# Patient Record
Sex: Female | Born: 1980 | Race: White | Hispanic: Yes | Marital: Single | Smoking: Never smoker
Health system: Southern US, Community
[De-identification: ages and names within clinical notes are randomized; demographics above are authoritative.]

---

## 2014-04-30 ENCOUNTER — Encounter (HOSPITAL_COMMUNITY): Payer: Self-pay | Admitting: Emergency Medicine

## 2014-04-30 ENCOUNTER — Emergency Department (HOSPITAL_COMMUNITY)
Admission: EM | Admit: 2014-04-30 | Discharge: 2014-04-30 | Disposition: A | Discharge status: Home / Self Care | Payer: 59 | Attending: Emergency Medicine | Admitting: Emergency Medicine

## 2014-04-30 ENCOUNTER — Emergency Department (HOSPITAL_COMMUNITY): Payer: 59

## 2014-04-30 DIAGNOSIS — Y92410 Unspecified street and highway as the place of occurrence of the external cause: Secondary | ICD-10-CM | POA: Insufficient documentation

## 2014-04-30 DIAGNOSIS — Z791 Long term (current) use of non-steroidal anti-inflammatories (NSAID): Secondary | ICD-10-CM | POA: Insufficient documentation

## 2014-04-30 DIAGNOSIS — Z79899 Other long term (current) drug therapy: Secondary | ICD-10-CM | POA: Insufficient documentation

## 2014-04-30 DIAGNOSIS — F411 Generalized anxiety disorder: Secondary | ICD-10-CM | POA: Insufficient documentation

## 2014-04-30 DIAGNOSIS — M542 Cervicalgia: Secondary | ICD-10-CM

## 2014-04-30 DIAGNOSIS — R0789 Other chest pain: Secondary | ICD-10-CM

## 2014-04-30 DIAGNOSIS — S199XXA Unspecified injury of neck, initial encounter: Secondary | ICD-10-CM

## 2014-04-30 DIAGNOSIS — S0993XA Unspecified injury of face, initial encounter: Secondary | ICD-10-CM | POA: Insufficient documentation

## 2014-04-30 DIAGNOSIS — Z3202 Encounter for pregnancy test, result negative: Secondary | ICD-10-CM | POA: Insufficient documentation

## 2014-04-30 DIAGNOSIS — F419 Anxiety disorder, unspecified: Secondary | ICD-10-CM

## 2014-04-30 DIAGNOSIS — S298XXA Other specified injuries of thorax, initial encounter: Secondary | ICD-10-CM | POA: Insufficient documentation

## 2014-04-30 DIAGNOSIS — Y9389 Activity, other specified: Secondary | ICD-10-CM | POA: Insufficient documentation

## 2014-04-30 LAB — POC URINE PREG, ED: Preg Test, Ur: NEGATIVE

## 2014-04-30 LAB — TROPONIN I: Troponin I: <0.3 ng/mL (ref <0.30)

## 2014-04-30 MED ORDER — CYCLOBENZAPRINE HCL 10 MG PO TABS
5.0000 mg | ORAL_TABLET | Freq: Once | ORAL | Status: AC
  Administered 2014-04-30: 5 mg via ORAL
  Filled 2014-04-30: qty 1

## 2014-04-30 MED ORDER — NAPROXEN 375 MG PO TABS: 375.0000 mg | ORAL_TABLET | Freq: Two times a day (BID) | ORAL | Status: AC

## 2014-04-30 MED ORDER — CYCLOBENZAPRINE HCL 5 MG PO TABS: 5.0000 mg | ORAL_TABLET | Freq: Three times a day (TID) | ORAL | Status: AC | PRN

## 2014-04-30 MED ORDER — IBUPROFEN 200 MG PO TABS
600.0000 mg | ORAL_TABLET | Freq: Once | ORAL | Status: AC
  Administered 2014-04-30: 600 mg via ORAL
  Filled 2014-04-30: qty 3

## 2014-04-30 NOTE — Progress Notes (Signed)
  CARE MANAGEMENT ED NOTE 04/30/2014  Patient:  [name],[name]   Account Number:  [id number]  Date Initiated:  04/30/2014  Documentation initiated by:  Edd Arbour  Subjective/Objective Assessment:   33 yr old [place] pt with [employer] insurance card she showed Cm. - Pt c/o of centralized chest pain started 7pm last night constant until bedtime. Pt awoke this am late for work. Pt c/o pain was present 6/10 with no change     Subjective/Objective Assessment Detail:   Pt states no pcp     Action/Plan:   ED Cm spoke with pt about pcp services for f/u care [facility] ED CM spoke with pt on how to obtain an in network pcp with insurance coverage via the customer service number or web site  Cm reviewed ED level of care for crisis/emergent services   Action/Plan Detail:   and community pcp level of care to manage continuous or chronic medical concerns.  The pt voiced understanding CM encouraged pt and discussed pt's responsibility to verify with pt's insurance carrier that any recommended medical provider of   Anticipated DC Date:  04/30/2014     Status Recommendation to Physician:   Result of Recommendation:    Other ED Services  Consult Working Plan    DC Planning Services  Other  PCP issues  Outpatient Services - Pt will follow up    Choice offered to / List presented to:            Status of service:  Completed, signed off  ED Comments:  is within the carrier's network. The pt voiced understanding  ED Comments Detail:

## 2014-04-30 NOTE — Discharge Instructions (Signed)
Ice packs to the injured or sore muscles and use heat to relax the muscles. Take the medications for pain and muscle spasms. Return to the ED for any problems listed on the head injury sheet. Recheck if you aren't improving in the next week. If you continue to have neck pain you can be rechecked by Dr August Saucer, the orthopedist on call. Look at the outpatient resource sheet to get a counselor for your stress.
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
## 2014-04-30 NOTE — ED Notes (Signed)
Patient transported to X-ray 
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
## 2014-04-30 NOTE — ED Notes (Signed)
MD at bedside. EDP Iva Lynelle Doctor present to evaluate this pt
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
